# Patient Record
Sex: Female | Born: 2000 | Race: White | Hispanic: No | Marital: Single | Smoking: Never smoker
Health system: Southern US, Community
[De-identification: ages and names within clinical notes are randomized; demographics above are authoritative.]

## PROBLEM LIST (undated history)

## (undated) DIAGNOSIS — Z8619 Personal history of other infectious and parasitic diseases: Secondary | ICD-10-CM

## (undated) DIAGNOSIS — G43009 Migraine without aura, not intractable, without status migrainosus: Secondary | ICD-10-CM

## (undated) DIAGNOSIS — J45909 Unspecified asthma, uncomplicated: Secondary | ICD-10-CM

## (undated) HISTORY — DX: Migraine without aura, not intractable, without status migrainosus: G43.009

## (undated) HISTORY — DX: Unspecified asthma, uncomplicated: J45.909

## (undated) HISTORY — PX: WISDOM TOOTH EXTRACTION: SHX21

## (undated) HISTORY — DX: Personal history of other infectious and parasitic diseases: Z86.19

---
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
## 2022-02-10 ENCOUNTER — Ambulatory Visit: Payer: Self-pay | Admitting: Obstetrics

## 2022-09-13 ENCOUNTER — Ambulatory Visit (INDEPENDENT_AMBULATORY_CARE_PROVIDER_SITE_OTHER): Payer: Medicaid Other | Admitting: Obstetrics

## 2022-09-13 ENCOUNTER — Encounter: Payer: Self-pay | Admitting: Obstetrics

## 2022-09-13 VITALS — BP 119/76 | HR 93 | Ht 64.0 in | Wt 122.0 lb

## 2022-09-13 DIAGNOSIS — Z3009 Encounter for other general counseling and advice on contraception: Secondary | ICD-10-CM

## 2022-09-13 DIAGNOSIS — Z3041 Encounter for surveillance of contraceptive pills: Secondary | ICD-10-CM

## 2022-09-13 DIAGNOSIS — Z30011 Encounter for initial prescription of contraceptive pills: Secondary | ICD-10-CM

## 2022-09-13 DIAGNOSIS — Z30430 Encounter for insertion of intrauterine contraceptive device: Secondary | ICD-10-CM

## 2022-09-13 MED ORDER — MISOPROSTOL 200 MCG PO TABS: 200.0000 ug | ORAL_TABLET | Freq: Once | ORAL | 0 refills | Status: DC

## 2022-09-13 MED ORDER — NORETHIN ACE-ETH ESTRAD-FE 1-20 MG-MCG PO TABS: 1.0000 | ORAL_TABLET | Freq: Every day | ORAL | 11 refills | Status: DC

## 2022-09-13 NOTE — Progress Notes (Signed)
Patient is a 22 y.o. G0P0000 presenting for contraception consult.  She is currently on OCP (estrogen/progesterone) and desiring to start  another method. She has trialed many options, including the Nexplanon, the patch, OCPs and Depo.  In each case, she reports that she still has heavier bleeding patterns. Her present pills make her nauseous. .she has thought about getting an IUD.  She has a past medical history significant for no contraindication to estrogen.  She specifically denies a history of migraine with aura, chronic hypertension, history of DVT/PE, and smoking.  Reported Patient's last menstrual period was 08/21/2022 (exact date)..    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  She feels that she has run the gamut of birth control options. O:  BP 119/76   Pulse 93   Ht 5\' 4"  (1.626 m)   Wt 122 lb (55.3 kg)   LMP 08/21/2022 (Exact Date)   BMI 20.94 kg/m    Review of Systems  Constitutional: Negative.   HENT: Negative.    Eyes: Negative.   Respiratory: Negative.    Cardiovascular: Negative.   Gastrointestinal: Negative.   Genitourinary: Negative.   Musculoskeletal: Negative.   Skin: Negative.   All other systems reviewed and are negative. Physical Exam Constitutional:      Appearance: Normal appearance. She is normal weight.  HENT:     Head: Normocephalic and atraumatic.  Cardiovascular:     Rate and Rhythm: Normal rate and regular rhythm.     Pulses: Normal pulses.     Heart sounds: Normal heart sounds.  Pulmonary:     Effort: Pulmonary effort is normal.     Breath sounds: Normal breath sounds.  Genitourinary:    Comments: deferred Musculoskeletal:        General: Normal range of motion.  Skin:    General: Skin is warm and dry.  Neurological:     General: No focal deficit present.     Mental Status: She is alert and oriented to person, place, and time.  Psychiatric:        Mood and Affect: Mood normal.        Behavior: Behavior  normal.     O: encounter for contraceptive options        Metrorrhagia  P:  We reviewed her Hx of OCP, Nexplanon, Patch, Depo use. Interested in an IUD, likely the [place]. She wants to think about this and discuss with her mother. I have provided her information on this method and reviewed the insertion process. I did Rx her some Cytotec, and told her to take Ibuprofen before the visit if she gets the [place]. In the meantime, I am going to drop her Estrogen level in the OCP, and put her on Junel. She will call the [facility] office for an IUD appt if she decides on this route.  Mirna Mires, CNM  09/13/2022 3:07 PM

## 2022-09-13 NOTE — Patient Instructions (Signed)
Levonorgestrel Intrauterine Device What is this medication? LEVONORGESTREL (LEE voe nor jes trel) prevents ovulation and pregnancy. It may also be used to treat heavy periods. It belongs to a group of medications called contraceptives. This medication is a progestin hormone. This medicine may be used for other purposes; ask your health care provider or pharmacist if you have questions. COMMON BRAND NAME(S): Cameron Ali What should I tell my care team before I take this medication? They need to know if you have any of these conditions: Abnormal Pap smear Cancer of the breast, uterus, or cervix Diabetes Endometritis Genital or pelvic infection now or in the past Have more than one sexual partner or your partner has more than one partner Heart disease History of an ectopic or tubal pregnancy Immune system problems IUD in place Liver disease or tumor Problems with blood clots or take blood-thinners Seizures Use intravenous drugs Uterus of unusual shape Vaginal bleeding that has not been explained An unusual or allergic reaction to levonorgestrel, other hormones, silicone, or polyethylene, medications, foods, dyes, or preservatives Pregnant or trying to get pregnant Breast-feeding How should I use this medication? This device is placed inside the uterus by your care team. A patient package insert for the product will be given each time it is inserted. Be sure to read this information carefully each time. The sheet may change often. Talk to your care team about use of this medication in children. Special care may be needed. Overdosage: If you think you have taken too much of this medicine contact a poison control center or emergency room at once. NOTE: This medicine is only for you. Do not share this medicine with others. What if I miss a dose? This does not apply. Depending on the brand of device you have inserted, the device will need to be replaced every 3 to 8 years  if you wish to continue using this type of birth control. What may interact with this medication? Interactions are not expected. Tell your care team about all the medications you take. This list may not describe all possible interactions. Give your health care provider a list of all the medicines, herbs, non-prescription drugs, or dietary supplements you use. Also tell them if you smoke, drink alcohol, or use illegal drugs. Some items may interact with your medicine. What should I watch for while using this medication? Visit your care team for regular check-ups. Tell your care team if you or your partner becomes HIV positive or gets a sexually transmitted disease. Using this medication does not protect you or your partner against HIV or other sexually transmitted infections (STIs). You can check the placement of the IUD yourself by reaching up to the top of your vagina with clean fingers to feel the threads. Do not pull on the threads. It is a good habit to check placement after each menstrual period. Call your care team right away if you feel more of the IUD than just the threads or if you cannot feel the threads at all. The IUD may come out by itself. You may become pregnant if the device comes out. If you notice that the IUD has come out use a backup birth control method like condoms and call your care team. Using tampons will not change the position of the IUD and are okay to use during your period. This IUD can be safely scanned with magnetic resonance imaging (MRI) only under specific conditions. Before you have an MRI, tell your care team that  you have an IUD in place, and which type of IUD you have in place. What side effects may I notice from receiving this medication? Side effects that you should report to your care team as soon as possible: Allergic reactions--skin rash, itching, hives, swelling of the face, lips, tongue, or throat Blood clot--pain, swelling, or warmth in the leg, shortness  of breath, chest pain Gallbladder problems--severe stomach pain, nausea, vomiting, fever Increase in blood pressure Liver injury--right upper belly pain, loss of appetite, nausea, light-colored stool, dark yellow or brown urine, yellowing skin or eyes, unusual weakness or fatigue New or worsening migraines or headaches Pelvic inflammatory disease (PID)--fever, abdominal pain, pelvic pain, pain or trouble passing urine, spotting, bleeding during or after sex Stroke--sudden numbness or weakness of the face, arm, or leg, trouble speaking, confusion, trouble walking, loss of balance or coordination, dizziness, severe headache, change in vision Unusual vaginal discharge, itching, or odor Vaginal pain, irritation, or sores Worsening mood, feelings of depression Side effects that usually do not require medical attention (report to your care team if they continue or are bothersome): Breast pain or tenderness Dark patches of skin on the face or other sun-exposed areas Irregular menstrual cycles or spotting Nausea Weight gain This list may not describe all possible side effects. Call your doctor for medical advice about side effects. You may report side effects to FDA at 1-800-FDA-1088. Where should I keep my medication? This does not apply. NOTE: This sheet is a summary. It may not cover all possible information. If you have questions about this medicine, talk to your doctor, pharmacist, or health care provider.  2024 Elsevier/Gold Standard (2020-09-22 00:00:00)

## 2022-09-29 ENCOUNTER — Ambulatory Visit (INDEPENDENT_AMBULATORY_CARE_PROVIDER_SITE_OTHER): Payer: Medicaid Other | Admitting: Obstetrics & Gynecology

## 2022-09-29 ENCOUNTER — Other Ambulatory Visit (HOSPITAL_COMMUNITY)
Admission: RE | Admit: 2022-09-29 | Discharge: 2022-09-29 | Disposition: A | Discharge status: Home / Self Care | Payer: Medicaid Other | Source: Ambulatory Visit | Attending: Obstetrics & Gynecology | Admitting: Obstetrics & Gynecology

## 2022-09-29 ENCOUNTER — Encounter: Payer: Self-pay | Admitting: Obstetrics & Gynecology

## 2022-09-29 VITALS — BP 125/76 | Ht 64.0 in | Wt 123.0 lb

## 2022-09-29 DIAGNOSIS — Z30430 Encounter for insertion of intrauterine contraceptive device: Secondary | ICD-10-CM

## 2022-09-29 DIAGNOSIS — Z3202 Encounter for pregnancy test, result negative: Secondary | ICD-10-CM

## 2022-09-29 DIAGNOSIS — Z113 Encounter for screening for infections with a predominantly sexual mode of transmission: Secondary | ICD-10-CM | POA: Insufficient documentation

## 2022-09-29 LAB — POCT URINE PREGNANCY: Preg Test, Ur: NEGATIVE

## 2022-09-29 NOTE — Progress Notes (Signed)
    GYNECOLOGY PROGRESS NOTE  Subjective:    Patient ID: Morgan Wallace, female    DOB: 2000-11-15, 22 y.o.   MRN: [id number]  HPI  Patient is a 22 y.o. G0P0000 female who presents for insertion of Kyleena. She has tried Nexplanon, depo provera and OCPs. She has has some issue with each of these. She has heavy painful periods. She has been monogamous for about a year. Her boyfriend has decided that they should abstain until marriage, hasn't had sex for about 4 months.  The following portions of the patient's history were reviewed and updated as appropriate: allergies, current medications, past family history, past medical history, past social history, past surgical history, and problem list.  Review of Systems Pertinent items are noted in HPI.   Objective:   Blood pressure 125/76, height 5\' 4"  (1.626 m), weight 123 lb (55.8 kg), last menstrual period 08/21/2022. Body mass index is 21.11 kg/m. Well nourished, well hydrated White female, no apparent distress She is ambulating and conversing normally.  UPT negative, consent signed, Time out procedure done. Bimanual exam revealed a uterus NSSA, no adnexal masses or tenderness Cervix prepped with betadine and Hurricaine spray and then grasped with a single tooth tenaculum. Rutha Bouchard was easily placed and the strings were cut to 3-4 cm. Uterus sounded to 8 cm. She tolerated the procedure well.   Assessment:   1. Encounter for IUD insertion    2.     STI screening Plan:   1. Encounter for IUD insertion  - POCT urine pregnancy  Come back for IUD check in 4 weeks

## 2022-09-30 LAB — HEPATITIS C ANTIBODY: Hep C Virus Ab: NONREACTIVE

## 2022-09-30 LAB — RPR: RPR Ser Ql: NONREACTIVE

## 2022-09-30 LAB — HEPATITIS B SURFACE ANTIGEN: Hepatitis B Surface Ag: NEGATIVE

## 2022-09-30 LAB — HIV ANTIBODY (ROUTINE TESTING W REFLEX): HIV Screen 4th Generation wRfx: NONREACTIVE

## 2022-10-03 LAB — CERVICOVAGINAL ANCILLARY ONLY
Bacterial Vaginitis (gardnerella): NEGATIVE
Candida Glabrata: NEGATIVE
Candida Vaginitis: NEGATIVE
Chlamydia: NEGATIVE
Comment: NEGATIVE
Comment: NEGATIVE
Comment: NEGATIVE
Comment: NEGATIVE
Comment: NEGATIVE
Comment: NORMAL
Neisseria Gonorrhea: NEGATIVE
Trichomonas: NEGATIVE

## 2022-10-17 MED ORDER — LEVONORGESTREL 19.5 MG IU IUD
INTRAUTERINE_SYSTEM | Freq: Once | INTRAUTERINE | Status: AC
Start: 2022-10-17 — End: 2022-09-29

## 2022-10-17 NOTE — Addendum Note (Signed)
Addended by: Cornelius Moras D on: 10/17/2022 03:05 PM   Modules accepted: Orders

## 2022-10-24 ENCOUNTER — Ambulatory Visit: Payer: Medicaid Other | Admitting: Obstetrics & Gynecology

## 2022-12-11 DIAGNOSIS — F4310 Post-traumatic stress disorder, unspecified: Secondary | ICD-10-CM | POA: Diagnosis not present

## 2022-12-19 ENCOUNTER — Ambulatory Visit (INDEPENDENT_AMBULATORY_CARE_PROVIDER_SITE_OTHER): Payer: Medicaid Other | Admitting: Obstetrics & Gynecology

## 2022-12-19 ENCOUNTER — Encounter: Payer: Self-pay | Admitting: Obstetrics & Gynecology

## 2022-12-19 VITALS — Ht 64.0 in | Wt 117.0 lb

## 2022-12-19 DIAGNOSIS — Z30431 Encounter for routine checking of intrauterine contraceptive device: Secondary | ICD-10-CM | POA: Diagnosis not present

## 2022-12-19 NOTE — Progress Notes (Addendum)
    GYNECOLOGY PROGRESS NOTE  Subjective:    Patient ID: Morgan Wallace, female    DOB: 2000-01-26, 22 y.o.   MRN: [id number]  HPI  Patient is a 22 y.o. single G0 here for IUD string check. I placed a Kyleena about 2 months ago for management of her heavy painful periods. She is restarted having sex with her boyfriend about a week ago.  She has not had any heavy painful bleeding since getting the Mirena, just some occasional spotting. She does have some cramping and has taken Tylenol. She reports that the cramping is slowly improving.  The following portions of the patient's history were reviewed and updated as appropriate: allergies, current medications, past family history, past medical history, past social history, past surgical history, and problem list.  Review of Systems Pertinent items are noted in HPI.  She attends [employer], lives at home. STI testing at last visit was normal. She is working at [employer] and is UTD with her flu vaccine.  Objective:   There were no vitals taken for this visit. There is no height or weight on file to calculate BMI. Well nourished, well hydrated White female, no apparent distress She is ambulating and conversing normally. Pederson spec used. I did not see IUD strings. Bedside ultrasound showed IUD to be at the fundus in proper position.   Assessment:   IUD string check Preventative care  Plan:   I rec'd Gardasil and she was given info. She wants to discuss this with her mom.

## 2023-02-06 DIAGNOSIS — F4310 Post-traumatic stress disorder, unspecified: Secondary | ICD-10-CM | POA: Diagnosis not present

## 2023-03-27 ENCOUNTER — Encounter: Payer: Self-pay | Admitting: Family Medicine

## 2023-03-27 ENCOUNTER — Ambulatory Visit (INDEPENDENT_AMBULATORY_CARE_PROVIDER_SITE_OTHER): Payer: 59 | Admitting: Family Medicine

## 2023-03-27 VITALS — BP 118/66 | HR 85 | Temp 98.2°F | Ht 65.25 in | Wt 120.5 lb

## 2023-03-27 DIAGNOSIS — N921 Excessive and frequent menstruation with irregular cycle: Secondary | ICD-10-CM | POA: Diagnosis not present

## 2023-03-27 DIAGNOSIS — R946 Abnormal results of thyroid function studies: Secondary | ICD-10-CM

## 2023-03-27 DIAGNOSIS — G250 Essential tremor: Secondary | ICD-10-CM | POA: Diagnosis not present

## 2023-03-27 DIAGNOSIS — G43909 Migraine, unspecified, not intractable, without status migrainosus: Secondary | ICD-10-CM | POA: Diagnosis not present

## 2023-03-27 DIAGNOSIS — F4323 Adjustment disorder with mixed anxiety and depressed mood: Secondary | ICD-10-CM

## 2023-03-27 LAB — TSH: TSH: 0.76 u[IU]/mL (ref 0.35–5.50)

## 2023-03-27 LAB — T4, FREE: Free T4: 0.83 ng/dL (ref 0.60–1.60)

## 2023-03-27 MED ORDER — ESCITALOPRAM OXALATE 5 MG PO TABS: 5.0000 mg | ORAL_TABLET | Freq: Every day | ORAL | 6 refills | Status: DC

## 2023-03-27 NOTE — Assessment & Plan Note (Signed)
 Ongoing for 4 months.  Discussed possible pharmacologic treatment as well as mechanisms of action of SSRIs vs wellbutrin. Reviewed risks of increased suicidality after starting antidepressant. Reviewed may take at least 4 wks to get full mood benefit.  She will continue seeing counselor.  Will start Lexapro 5mg  daily.  RTC 4-6 wks f/u visit  Update sooner if any trouble tolerating medication.

## 2023-03-27 NOTE — Assessment & Plan Note (Signed)
 Followed by Melrose Nakayama Marice Potter)  Currently on Carlisle IUD with benefit. Mood difficulty may have started with IUD - but desires to continue this.

## 2023-03-27 NOTE — Assessment & Plan Note (Signed)
 H/o low TSH, elevated T3, but normalized on repeat testing. Also had remote normal thyroid US and thyroid uptake scan.  Update TFTs in setting of tremor and more difficulty with mood.

## 2023-03-27 NOTE — Patient Instructions (Addendum)
 Thyroid labs today  Try lexapro 5mg  daily for mood Return in 5-6 weeks for follow up visit on mood

## 2023-03-27 NOTE — Progress Notes (Signed)
 Ph: [phone] Fax: [phone]   Patient ID: Morgan Wallace, female    DOB: [birth year], 23 y.o.   MRN: [id number]  This visit was conducted in person.  BP 118/66   Pulse 85   Temp 98.2 F (36.8 C) (Oral)   Ht 5' 5.25" (1.657 m)   Wt 120 lb 8 oz (54.7 kg)   SpO2 98%   BMI 19.90 kg/m    CC: new pt to establish care  Subjective:   HPI: Morgan Wallace is a 23 y.o. female presenting on 03/27/2023 for New Patient (Initial Visit)   Step-daughter of Jonette Pesa.  Previously saw Drusilla Kanner NP at [facility].   Regularly sees OBGYN, has [place] IUD placed 09/2022  Has been on OCP since age 26yo due to dysmenorrhea and heavy periods. No h/o endometriosis.  H/o tremors - not on medication - 2 caffeinated drinks/day limit Saw pediatric endocrinology 2020 due to concern over possible hyperthyroidism - however workup largely reassuring.   H/o depressed mood/anxiety - acutely worse after lost 2 grandparents ~2023 - this lasted for 6 months.  Now regularly sees therapist for the past year with benefit.   Over the past 4 months notes mood difficulty - predominantly lack of motivation.  Sleep fluctuates - sometimes sleeps well, sometimes has trouble sleeping.  Notes deteriorated concentration/attention.  Appetite - ok.   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 Stays involved at church.   No manic symptoms.  Never SI/HI.  Biological mother is alcoholic and drug addict - she is not involved.  No fmhx bipolar d/o.   H/o migraines - frequent headaches every few weeks, migraines about one every 6 months, managed with Nurtec with benefit. Unknown triggers.      Relevant past medical, surgical, family and social history reviewed and updated as indicated. Interim medical history since our last visit reviewed. Allergies and medications reviewed and  updated. Outpatient Medications Prior to Visit  Medication Sig Dispense Refill   levonorgestrel (KYLEENA) 19.5 MG IUD 1 each by Intrauterine route once.     No facility-administered medications prior to visit.    Past Medical History:  Diagnosis Date   Childhood asthma    grew out of this   Encounter for prescription for depo-Provera 05/16/2021   05/16/2021   H/O cold sores    Migraine without aura     Past Surgical History:  Procedure Laterality Date   WISDOM TOOTH EXTRACTION      Family History  Problem Relation Age of Onset   Drug abuse Mother    Alcohol abuse Mother    Hyperlipidemia Father    Hypertension Maternal Grandfather    Heart disease Maternal Grandfather    Hyperlipidemia Maternal Grandfather    Diabetes Maternal Grandfather    COPD Maternal Grandfather    Hypertension Paternal Grandmother    Hyperlipidemia Paternal Grandmother    Diabetes type II Paternal Grandmother    Migraines Paternal Grandmother    Stroke Paternal Grandmother    Heart attack Paternal Grandfather 17 - 44   Stroke Paternal Grandfather    Hypertension Paternal Grandfather    Diabetes type II Paternal Grandfather    Hyperlipidemia Paternal Grandfather    Cancer Neg Hx     Social History   Tobacco Use   Smoking status: Never    Passive exposure: Yes   Smokeless tobacco:  Never  Vaping Use   Vaping status: Every Day  Substance Use Topics   Alcohol use: No   Drug use: No    Per HPI unless specifically indicated in ROS section below Review of Systems  Objective:  BP 118/66   Pulse 85   Temp 98.2 F (36.8 C) (Oral)   Ht 5' 5.25" (1.657 m)   Wt 120 lb 8 oz (54.7 kg)   SpO2 98%   BMI 19.90 kg/m   Wt Readings from Last 3 Encounters:  03/27/23 120 lb 8 oz (54.7 kg)  12/19/22 117 lb (53.1 kg) (28%, Z= -0.59)*  09/29/22 123 lb (55.8 kg) (40%, Z= -0.25)*   * Growth percentiles are based on CDC (Girls, 2-20 Years) data.      Physical Exam Vitals and nursing note reviewed.   Constitutional:      Appearance: Normal appearance. She is not ill-appearing.  HENT:     Head: Normocephalic and atraumatic.     Mouth/Throat:     Mouth: Mucous membranes are moist.     Pharynx: Oropharynx is clear. No oropharyngeal exudate or posterior oropharyngeal erythema.  Eyes:     Extraocular Movements: Extraocular movements intact.     Pupils: Pupils are equal, round, and reactive to light.  Neck:     Thyroid: No thyroid mass, thyromegaly or thyroid tenderness.  Cardiovascular:     Rate and Rhythm: Normal rate and regular rhythm.     Pulses: Normal pulses.     Heart sounds: Normal heart sounds. No murmur heard. Pulmonary:     Effort: Pulmonary effort is normal. No respiratory distress.     Breath sounds: Normal breath sounds. No wheezing, rhonchi or rales.  Musculoskeletal:     Cervical back: Normal range of motion and neck supple.     Right lower leg: No edema.     Left lower leg: No edema.  Skin:    General: Skin is warm and dry.     Findings: No rash.  Neurological:     Mental Status: She is alert.  Psychiatric:        Mood and Affect: Mood normal.        Behavior: Behavior normal.        Thought Content: Thought content normal.        Judgment: Judgment normal.       Lab Results  Component Value Date   TSH 0.91 10/08/2018   T3TOTAL 142 10/08/2018   T4TOTAL 8.0 09/09/2018      03/27/2023    3:51 PM  Depression screen PHQ 2/9  Decreased Interest 0  Down, Depressed, Hopeless 1  PHQ - 2 Score 1  Altered sleeping 3  Tired, decreased energy 3  Change in appetite 0  Feeling bad or failure about yourself  0  Trouble concentrating 0  Moving slowly or fidgety/restless 0  Suicidal thoughts 0  PHQ-9 Score 7  Difficult doing work/chores Not difficult at all       03/27/2023    3:51 PM  GAD 7 : Generalized Anxiety Score  Nervous, Anxious, on Edge 2  Control/stop worrying 2  Worry too much - different things 2  Trouble relaxing 1  Restless 0  Easily  annoyed or irritable 3  Afraid - awful might happen 0  Total GAD 7 Score 10  Anxiety Difficulty Somewhat difficult   Assessment & Plan:   Problem List Items Addressed This Visit     Menometrorrhagia   Followed by Melrose Nakayama Marice Potter)  Currently on Kyleena IUD with benefit. Mood difficulty may have started with IUD - but desires to continue this.       Abnormal thyroid function test   H/o low TSH, elevated T3, but normalized on repeat testing. Also had remote normal thyroid US and thyroid uptake scan.  Update TFTs in setting of tremor and more difficulty with mood.      Relevant Orders   TSH   T4, free   T3   Essential tremor   H/o this, states biological mom and maternal grandmother also had this. Remotely on propranolol 15mg  bid but stopped due to sedation.  Desires to stay off medication at this time.  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  Discussed possible pharmacologic treatment as well as mechanisms of action of SSRIs vs wellbutrin. Reviewed risks of increased suicidality after starting antidepressant. Reviewed may take at least 4 wks to get full mood benefit.  She will continue seeing counselor.  Will start Lexapro 5mg  daily.  RTC 4-6 wks f/u visit  Update sooner if any trouble tolerating medication.       Migraine   Rare migraines managed with Nurtec with benefit.       Relevant Medications   escitalopram (LEXAPRO) 5 MG tablet     Meds ordered this encounter  Medications   escitalopram (LEXAPRO) 5 MG tablet    Sig: Take 1 tablet (5 mg total) by mouth daily.    Dispense:  30 tablet    Refill:  6    Orders Placed This Encounter  Procedures   TSH   T4, free   T3    Patient Instructions  Thyroid labs today  Try lexapro 5mg  daily for mood Return in 5-6 weeks for follow up visit on mood   Follow up plan: Return in about 6 weeks (around  05/08/2023) for follow up visit.  Eustaquio Boyden, MD

## 2023-03-27 NOTE — Assessment & Plan Note (Signed)
 Rare migraines managed with Nurtec with benefit.

## 2023-03-27 NOTE — Assessment & Plan Note (Signed)
 H/o this, states biological mom and maternal grandmother also had this. Remotely on propranolol 15mg  bid but stopped due to sedation.  Desires to stay off medication at this time.  She limits caffeine to 2 caffeinated beverages /day

## 2023-03-28 ENCOUNTER — Encounter: Payer: Self-pay | Admitting: Family Medicine

## 2023-03-28 LAB — T3: T3, Total: 115 ng/dL (ref 86–192)

## 2023-04-24 ENCOUNTER — Encounter: Payer: Self-pay | Admitting: Family Medicine

## 2023-04-24 ENCOUNTER — Ambulatory Visit

## 2023-04-24 ENCOUNTER — Ambulatory Visit (INDEPENDENT_AMBULATORY_CARE_PROVIDER_SITE_OTHER): Admitting: Family Medicine

## 2023-04-24 ENCOUNTER — Ambulatory Visit (INDEPENDENT_AMBULATORY_CARE_PROVIDER_SITE_OTHER)

## 2023-04-24 ENCOUNTER — Other Ambulatory Visit: Payer: Self-pay | Admitting: Family Medicine

## 2023-04-24 VITALS — BP 110/60 | HR 75 | Temp 98.0°F | Ht 65.0 in | Wt 123.0 lb

## 2023-04-24 DIAGNOSIS — M542 Cervicalgia: Secondary | ICD-10-CM

## 2023-04-24 DIAGNOSIS — M25511 Pain in right shoulder: Secondary | ICD-10-CM

## 2023-04-24 MED ORDER — METHOCARBAMOL 500 MG PO TABS: 500.0000 mg | ORAL_TABLET | Freq: Three times a day (TID) | ORAL | 0 refills | Status: AC | PRN

## 2023-04-24 MED ORDER — PREDNISONE 10 MG PO TABS: ORAL_TABLET | ORAL | 0 refills | Status: AC

## 2023-04-24 NOTE — Patient Instructions (Signed)
-  Right shoulder x-ray was completed today.  -Ordered cervical x-ray, but may need to make an appointment to have x-ray completed if radiology is closed.  -Will call with results on both x-rays. -Prescribed Prednisone 10mg , 6 day taper. Recommend to start taking medication in the morning. Advised against taking any NSAIDS (Ibuprofen, Advil, Naproxen, or Aleve) while taking Prednisone.  -Prescribed Robaxin 500mg  tablet, take 1 tablet every 8 hours as needed for pain to help relax the muscles. Caution: this medication can cause drowsiness.  -After taking Prednisone, send a message through MyChart to provide an update on how you are doing with taking medication. If no improvement, recommend a referral back to ortho.

## 2023-04-24 NOTE — Progress Notes (Signed)
 Acute Office Visit   Subjective:  Patient ID: Morgan Wallace, female    DOB: 02/20/2000, 22 y.o.   MRN: [id number]  Chief Complaint  Patient presents with   Shoulder Pain    Shoulder Pain    Patient is complaining of right cervical pain that radiates to right shoulder pain. It started hurting about a month ago, but two weeks ago the pain become worse. Describe the pain as sharp, constant. Rotating heat and ice compresses seem to help. 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 She reports she has not been able to sleep as well with having the pain. She is using a travel pillow to comfort her neck to sleep. Denies any numbness and tingling in upper extremities.   She reports this will flare up every once in a while. She went to [facility] about 3-4 years ago for the same pain. She had a MRI, but not sure if it was of her shoulder or cervical spine.   ROS See HPI above      Objective:   BP 110/60   Pulse 75   Temp 98 F (36.7 C) (Oral)   Ht 5\' 5"  (1.651 m)   Wt 123 lb (55.8 kg)   SpO2 98%   BMI 20.47 kg/m    Physical Exam Vitals reviewed.  Constitutional:      General: She is not in acute distress.    Appearance: Normal appearance. She is not ill-appearing, toxic-appearing or diaphoretic.  Eyes:     General:        Right eye: No discharge.        Left eye: No discharge.     Conjunctiva/sclera: Conjunctivae normal.  Cardiovascular:     Rate and Rhythm: Normal rate.  Pulmonary:     Effort: Pulmonary effort is normal. No respiratory distress.  Musculoskeletal:        General: No swelling, tenderness or deformity. Normal range of motion.     Right shoulder: No swelling, deformity or tenderness. Normal range of motion.     Cervical back: Pain with movement (Pain when tilting her head to the left) present. Normal range of motion.  Skin:    General: Skin is warm and dry.  Neurological:     General: No focal deficit  present.     Mental Status: She is alert and oriented to person, place, and time. Mental status is at baseline.  Psychiatric:        Mood and Affect: Mood normal.        Behavior: Behavior normal.        Thought Content: Thought content normal.        Judgment: Judgment normal.       Assessment & Plan:  Acute pain of right shoulder -     predniSONE; Take 6 tablets (60 mg total) by mouth daily with breakfast for 1 day, THEN 5 tablets (50 mg total) daily with breakfast for 1 day, THEN 4 tablets (40 mg total) daily with breakfast for 1 day, THEN 3 tablets (30 mg total) daily with breakfast for 1 day, THEN 2 tablets (20 mg total) daily with breakfast for 1 day, THEN 1 tablet (10 mg total) daily with breakfast for 1 day.  Dispense: 21 tablet; Refill: 0 -     Methocarbamol; Take 1 tablet (500 mg total) by mouth every 8 (eight) hours as needed for up to 5 days for muscle spasms.  Dispense:  15 tablet; Refill: 0  Cervical pain -     DG Cervical Spine Complete; Future -     predniSONE; Take 6 tablets (60 mg total) by mouth daily with breakfast for 1 day, THEN 5 tablets (50 mg total) daily with breakfast for 1 day, THEN 4 tablets (40 mg total) daily with breakfast for 1 day, THEN 3 tablets (30 mg total) daily with breakfast for 1 day, THEN 2 tablets (20 mg total) daily with breakfast for 1 day, THEN 1 tablet (10 mg total) daily with breakfast for 1 day.  Dispense: 21 tablet; Refill: 0 -     Methocarbamol; Take 1 tablet (500 mg total) by mouth every 8 (eight) hours as needed for up to 5 days for muscle spasms.  Dispense: 15 tablet; Refill: 0  -Right shoulder x-ray was completed today.  -Ordered cervical x-ray, but may need to make an appointment to have x-ray completed if radiology is closed.  -Will call with results on both x-rays. -Prescribed Prednisone 10mg , 6 day taper. Recommend to start taking medication in the morning. Advised against taking any NSAIDS (Ibuprofen, Advil, Naproxen, or Aleve) while  taking Prednisone.  -Prescribed Robaxin 500mg  tablet, take 1 tablet every 8 hours as needed for pain to help relax the muscles. Caution: this medication can cause drowsiness.  -After taking Prednisone, send a message through MyChart to provide an update on how you are doing with taking medication. If no improvement, recommend a referral back to ortho.   Zandra Abts, NP

## 2023-04-25 ENCOUNTER — Ambulatory Visit (INDEPENDENT_AMBULATORY_CARE_PROVIDER_SITE_OTHER)
Admission: RE | Admit: 2023-04-25 | Discharge: 2023-04-25 | Disposition: A | Discharge status: Home / Self Care | Source: Ambulatory Visit | Attending: Family Medicine

## 2023-04-25 DIAGNOSIS — M542 Cervicalgia: Secondary | ICD-10-CM

## 2023-04-25 DIAGNOSIS — M47812 Spondylosis without myelopathy or radiculopathy, cervical region: Secondary | ICD-10-CM | POA: Diagnosis not present

## 2023-04-26 ENCOUNTER — Encounter: Payer: Self-pay | Admitting: Family Medicine

## 2023-04-26 ENCOUNTER — Other Ambulatory Visit

## 2023-04-26 DIAGNOSIS — M25511 Pain in right shoulder: Secondary | ICD-10-CM

## 2023-04-26 DIAGNOSIS — M542 Cervicalgia: Secondary | ICD-10-CM

## 2023-04-28 ENCOUNTER — Other Ambulatory Visit: Payer: Self-pay | Admitting: Family Medicine

## 2023-05-09 ENCOUNTER — Ambulatory Visit: Admitting: Family Medicine

## 2023-05-14 ENCOUNTER — Encounter: Payer: Self-pay | Admitting: Family Medicine

## 2023-05-23 ENCOUNTER — Ambulatory Visit: Admitting: Family Medicine

## 2023-06-14 ENCOUNTER — Other Ambulatory Visit

## 2023-06-14 ENCOUNTER — Encounter: Payer: Self-pay | Admitting: Family Medicine

## 2023-06-14 DIAGNOSIS — Z139 Encounter for screening, unspecified: Secondary | ICD-10-CM

## 2023-06-17 LAB — QUANTIFERON-TB GOLD PLUS
Mitogen-NIL: 6.62 [IU]/mL
NIL: 0.02 [IU]/mL
QuantiFERON-TB Gold Plus: NEGATIVE
TB1-NIL: <0 [IU]/mL
TB2-NIL: <0 [IU]/mL

## 2023-06-19 ENCOUNTER — Ambulatory Visit: Payer: Self-pay | Admitting: Family Medicine

## 2023-06-28 ENCOUNTER — Ambulatory Visit: Admitting: Family Medicine

## 2023-06-28 ENCOUNTER — Encounter: Payer: Self-pay | Admitting: Family Medicine

## 2023-06-28 VITALS — BP 114/72 | HR 98 | Temp 98.2°F | Ht 65.0 in | Wt 124.0 lb

## 2023-06-28 DIAGNOSIS — G4700 Insomnia, unspecified: Secondary | ICD-10-CM | POA: Insufficient documentation

## 2023-06-28 DIAGNOSIS — F4323 Adjustment disorder with mixed anxiety and depressed mood: Secondary | ICD-10-CM

## 2023-06-28 DIAGNOSIS — E611 Iron deficiency: Secondary | ICD-10-CM | POA: Diagnosis not present

## 2023-06-28 DIAGNOSIS — E559 Vitamin D deficiency, unspecified: Secondary | ICD-10-CM

## 2023-06-28 DIAGNOSIS — F5104 Psychophysiologic insomnia: Secondary | ICD-10-CM | POA: Diagnosis not present

## 2023-06-28 DIAGNOSIS — E538 Deficiency of other specified B group vitamins: Secondary | ICD-10-CM | POA: Diagnosis not present

## 2023-06-28 LAB — IBC PANEL
Iron: 169 ug/dL — ABNORMAL HIGH (ref 42–145)
Saturation Ratios: 46.3 % (ref 20.0–50.0)
TIBC: 365.4 ug/dL (ref 250.0–450.0)
Transferrin: 261 mg/dL (ref 212.0–360.0)

## 2023-06-28 LAB — CBC WITH DIFFERENTIAL/PLATELET
Basophils Absolute: 0 10*3/uL (ref 0.0–0.1)
Basophils Relative: 0.3 % (ref 0.0–3.0)
Eosinophils Absolute: 0.1 10*3/uL (ref 0.0–0.7)
Eosinophils Relative: 1.6 % (ref 0.0–5.0)
HCT: 39.2 % (ref 36.0–46.0)
Hemoglobin: 13.4 g/dL (ref 12.0–15.0)
Lymphocytes Relative: 40.7 % (ref 12.0–46.0)
Lymphs Abs: 2.2 10*3/uL (ref 0.7–4.0)
MCHC: 34.3 g/dL (ref 30.0–36.0)
MCV: 85.5 fl (ref 78.0–100.0)
Monocytes Absolute: 0.4 10*3/uL (ref 0.1–1.0)
Monocytes Relative: 7 % (ref 3.0–12.0)
Neutro Abs: 2.7 10*3/uL (ref 1.4–7.7)
Neutrophils Relative %: 50.4 % (ref 43.0–77.0)
Platelets: 270 10*3/uL (ref 150.0–400.0)
RBC: 4.59 Mil/uL (ref 3.87–5.11)
RDW: 12.6 % (ref 11.5–14.6)
WBC: 5.4 10*3/uL (ref 4.5–10.5)

## 2023-06-28 LAB — BASIC METABOLIC PANEL WITH GFR
BUN: 12 mg/dL (ref 6–23)
CO2: 25 meq/L (ref 19–32)
Calcium: 9.4 mg/dL (ref 8.4–10.5)
Chloride: 103 meq/L (ref 96–112)
Creatinine, Ser: 0.5 mg/dL (ref 0.40–1.20)
GFR: 134.96 mL/min (ref >60.00)
Glucose, Bld: 90 mg/dL (ref 70–99)
Potassium: 4.1 meq/L (ref 3.5–5.1)
Sodium: 138 meq/L (ref 135–145)

## 2023-06-28 LAB — FERRITIN: Ferritin: 30.2 ng/mL (ref 10.0–291.0)

## 2023-06-28 LAB — VITAMIN B12: Vitamin B-12: 224 pg/mL (ref 211–911)

## 2023-06-28 LAB — VITAMIN D 25 HYDROXY (VIT D DEFICIENCY, FRACTURES): VITD: 28.93 ng/mL — ABNORMAL LOW (ref 30.00–100.00)

## 2023-06-28 NOTE — Patient Instructions (Addendum)
 Labs today  Ok to use melatonin 1-5mg   at night time as needed for sleep.  ?stress related trouble sleeping.  Good to see you today.   Bedtime routine checklist: 1. Avoid naps during the day 2. Avoid stimulants such as caffeine and nicotine. Avoid bedtime alcohol (it can speed onset of sleep but the body's metabolism can cause awakenings). 3. All forms of exercise help ensure sound sleep - limit vigorous exercise to morning or late afternoon 4. Avoid food too close to bedtime including chocolate (which contains caffeine) 5. Soak up natural light 6. Establish regular bedtime routine. 7. Associate bed with sleep - avoid TV, computer or phone, reading while in bed. 8. Ensure pleasant, relaxing sleep environment - quiet, dark, cool room.

## 2023-06-28 NOTE — Assessment & Plan Note (Signed)
 Decided to stay off escitalopram  - didn't like taking daily pill.  Anxiety better as evidenced by improved GAD7 readings.  Will stay off medication. Continue counseling.

## 2023-06-28 NOTE — Assessment & Plan Note (Signed)
 Predominantly sleep initiation insomnia acutely worse over the past 2 weeks in setting of stressful life events.  Reviewed sleep hygiene/bedtime routine checklist. Ok to restart melatonin 1-5mg  nightly PRN which has previously been effective.  Discussed possible trial trazodone as next step if above not helpful.  In h/o vitamin and iron deficiencies as well as h/o fatigue, will update labs today.

## 2023-06-28 NOTE — Progress Notes (Signed)
 Ph: [phone] Fax: [phone]   Patient ID: Morgan Wallace, female    DOB: 2000/12/21, 22 y.o.   MRN: [id number]  This visit was conducted in person.  BP 114/72   Pulse 98   Temp 98.2 F (36.8 C) (Oral)   Ht 5\' 5"  (1.651 m)   Wt 124 lb (56.2 kg)   LMP 06/19/2023   SpO2 97%   BMI 20.63 kg/m    CC: 3 month mood f/u visit  Subjective:   HPI: Morgan Wallace is a 23 y.o. female presenting on 06/28/2023 for Medical Management of Chronic Issues (Here for mood f/u. )   See prior note for details.  Depression/anxiety followed by therapist - last visit we started escitalopram  5mg  daily. She tolerated this well but only took for 2 wks without noted difference and didn't like taking a daily pill so stopped. PHQ9 largely unchanged, GAD7 improved.   Notes ongoing trouble with sleep initiation insomnia, started 4 wks ago. 2 wks ago worsening. Notes daytime somnolence with frequent napping.  Non-restorative sleep. No snoring or witnessed apnea, PNDyspnea, infrequent morning headaches.  Has tried melatonin 5mg  nightly with benefit.  Sleep latency = 3-4 hours.   Fatigue is doing well.   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      Relevant past medical, surgical, family and social history reviewed and updated as indicated. Interim medical history since our last visit reviewed. Allergies and medications reviewed and updated. Outpatient Medications Prior to Visit  Medication Sig Dispense Refill   levonorgestrel  (KYLEENA ) 19.5 MG IUD 1 each by Intrauterine route once.     escitalopram  (LEXAPRO ) 5 MG tablet TAKE 1 TABLET (5 MG TOTAL) BY MOUTH DAILY. (Patient not taking: Reported on 06/28/2023) 90 tablet 1   No facility-administered medications prior to visit.     Per HPI unless specifically indicated in ROS section below Review of Systems  Objective:  BP 114/72   Pulse 98   Temp 98.2 F (36.8 C) (Oral)   Ht 5\' 5"  (1.651 m)   Wt 124 lb  (56.2 kg)   LMP 06/19/2023   SpO2 97%   BMI 20.63 kg/m   Wt Readings from Last 3 Encounters:  06/28/23 124 lb (56.2 kg)  04/24/23 123 lb (55.8 kg)  03/27/23 120 lb 8 oz (54.7 kg)      Physical Exam Vitals and nursing note reviewed.  Constitutional:      Appearance: Normal appearance. She is not ill-appearing.  HENT:     Head: Normocephalic and atraumatic.     Mouth/Throat:     Mouth: Mucous membranes are moist.     Pharynx: Oropharynx is clear. No oropharyngeal exudate or posterior oropharyngeal erythema.  Eyes:     Extraocular Movements: Extraocular movements intact.     Pupils: Pupils are equal, round, and reactive to light.  Neck:     Thyroid : No thyroid  mass or thyromegaly.  Cardiovascular:     Rate and Rhythm: Normal rate and regular rhythm.     Pulses: Normal pulses.     Heart sounds: Normal heart sounds. No murmur heard. Pulmonary:     Effort: Pulmonary effort is normal. No respiratory distress.     Breath sounds: Normal breath sounds. No wheezing, rhonchi or rales.  Musculoskeletal:     Cervical back: Neck supple.     Right lower leg: No edema.     Left lower leg: No edema.  Skin:    General: Skin is  warm and dry.     Findings: No rash.  Neurological:     Mental Status: She is alert.  Psychiatric:        Mood and Affect: Mood normal.        Behavior: Behavior normal.       Results for orders placed or performed in visit on 06/14/23  QuantiFERON-TB Gold Plus   Collection Time: 06/14/23  1:11 PM  Result Value Ref Range   QuantiFERON-TB Gold Plus NEGATIVE NEGATIVE   NIL 0.02 IU/mL   Mitogen-NIL 6.62 IU/mL   TB1-NIL <0.00 IU/mL   TB2-NIL <0.00 IU/mL   No results found for: "VITAMINB12"  No results found for: "IRON", "TIBC", "FERRITIN"  No results found for: "WBC", "HGB", "HCT", "MCV", "PLT"  Lab Results  Component Value Date   TSH 0.76 03/27/2023       06/28/2023   10:58 AM 03/27/2023    3:51 PM  Depression screen PHQ 2/9  Decreased Interest 0 0   Down, Depressed, Hopeless 0 1  PHQ - 2 Score 0 1  Altered sleeping 3 3  Tired, decreased energy 3 3  Change in appetite 0 0  Feeling bad or failure about yourself  0 0  Trouble concentrating 0 0  Moving slowly or fidgety/restless 0 0  Suicidal thoughts 0 0  PHQ-9 Score 6 7  Difficult doing work/chores Somewhat difficult Not difficult at all       06/28/2023   10:58 AM 03/27/2023    3:51 PM  GAD 7 : Generalized Anxiety Score  Nervous, Anxious, on Edge 0 2  Control/stop worrying 1 2  Worry too much - different things 1 2  Trouble relaxing 1 1  Restless 0 0  Easily annoyed or irritable 1 3  Afraid - awful might happen 0 0  Total GAD 7 Score 4 10  Anxiety Difficulty Somewhat difficult Somewhat difficult   Assessment & Plan:   Problem List Items Addressed This Visit     Adjustment disorder with mixed anxiety and depressed mood - Primary   Decided to stay off escitalopram  - didn't like taking daily pill.  Anxiety better as evidenced by improved GAD7 readings.  Will stay off medication. Continue counseling.       Insomnia   Predominantly sleep initiation insomnia acutely worse over the past 2 weeks in setting of stressful life events.  Reviewed sleep hygiene/bedtime routine checklist. Ok to restart melatonin 1-5mg  nightly PRN which has previously been effective.  Discussed possible trial trazodone as next step if above not helpful.  In h/o vitamin and iron deficiencies as well as h/o fatigue, will update labs today.       Other Visit Diagnoses       Vitamin B12 deficiency       Relevant Orders   Vitamin B12   Basic metabolic panel with GFR     Iron deficiency       Relevant Orders   CBC with Differential/Platelet   Ferritin   IBC panel     Vitamin D deficiency       Relevant Orders   VITAMIN D 25 Hydroxy (Vit-D Deficiency, Fractures)        No orders of the defined types were placed in this encounter.   Orders Placed This Encounter  Procedures   CBC with  Differential/Platelet   Vitamin B12   Ferritin   IBC panel   Basic metabolic panel with GFR   VITAMIN D 25 Hydroxy (Vit-D Deficiency, Fractures)  Patient Instructions  Labs today  Ok to use melatonin 1-5mg   at night time as needed for sleep.  ?stress related trouble sleeping.  Good to see you today.   Bedtime routine checklist: 1. Avoid naps during the day 2. Avoid stimulants such as caffeine and nicotine. Avoid bedtime alcohol (it can speed onset of sleep but the body's metabolism can cause awakenings). 3. All forms of exercise help ensure sound sleep - limit vigorous exercise to morning or late afternoon 4. Avoid food too close to bedtime including chocolate (which contains caffeine) 5. Soak up natural light 6. Establish regular bedtime routine. 7. Associate bed with sleep - avoid TV, computer or phone, reading while in bed. 8. Ensure pleasant, relaxing sleep environment - quiet, dark, cool room.   Follow up plan: Return if symptoms worsen or fail to improve.  Claire Crick, MD

## 2023-07-04 ENCOUNTER — Ambulatory Visit: Payer: Self-pay | Admitting: Family Medicine

## 2023-07-10 ENCOUNTER — Ambulatory Visit: Admitting: Obstetrics and Gynecology

## 2023-07-10 ENCOUNTER — Encounter: Payer: Self-pay | Admitting: Obstetrics and Gynecology

## 2023-07-10 VITALS — BP 97/61 | HR 105 | Ht 65.0 in | Wt 122.0 lb

## 2023-07-10 DIAGNOSIS — N6314 Unspecified lump in the right breast, lower inner quadrant: Secondary | ICD-10-CM | POA: Diagnosis not present

## 2023-07-10 NOTE — Patient Instructions (Signed)
 I value your feedback and you entrusting [place] with your care. If you get a [facility] patient survey, I would appreciate you taking the time to let us know about your experience today. Thank you!  [facility] ([place])--[phone]

## 2023-07-10 NOTE — Progress Notes (Signed)
 Morgan Crick, MD   Chief Complaint  Patient presents with   Breast Exam    Lump on RB, tender with touch x 2 days    HPI:      Morgan Wallace is a 23 y.o. G0P0000 whose LMP was Patient's last menstrual period was 06/19/2023., presents today for RT breast mass for 2 days. Boyfriend felt it randomly but pt doesn't do SBE. No pain/tenderness initially but tender now due to regularly checking it. No erythema/nipple d/c/trauma. No hx of breast masses. No FH breast cancer.  Kyleena  placed 09/29/22; pt has monthly spotting for 1-2 wks, min dysmen.   Patient Active Problem List   Diagnosis Date Noted   Insomnia 06/28/2023   Migraine 03/27/2023   Adjustment disorder with mixed anxiety and depressed mood 03/31/2019   Essential tremor 12/27/2018   Abnormal thyroid  function test 09/09/2018   Menometrorrhagia 01/24/2017    Past Surgical History:  Procedure Laterality Date   WISDOM TOOTH EXTRACTION      Family History  Problem Relation Age of Onset   Drug abuse Mother    Alcohol abuse Mother    Hyperlipidemia Father    Cancer Paternal Aunt        All over body   Hypertension Maternal Grandfather    Heart disease Maternal Grandfather    Hyperlipidemia Maternal Grandfather    Diabetes Maternal Grandfather    COPD Maternal Grandfather    Hypertension Paternal Grandmother    Hyperlipidemia Paternal Grandmother    Diabetes type II Paternal Grandmother    Migraines Paternal Grandmother    Stroke Paternal Grandmother    Heart attack Paternal Grandfather 2 - 2   Stroke Paternal Grandfather    Hypertension Paternal Grandfather    Diabetes type II Paternal Grandfather    Hyperlipidemia Paternal Grandfather    Ovarian cancer Maternal Great-grandmother        unsure of age    Social History   Socioeconomic History   Marital status: Single    Spouse name: Not on file   Number of children: Not on file   Years of education: Not on file   Highest education level:  Associate degree: academic program  Occupational History   Not on file  Tobacco Use   Smoking status: Never    Passive exposure: Yes   Smokeless tobacco: Never  Vaping Use   Vaping status: Every Day  Substance and Sexual Activity   Alcohol use: No   Drug use: No   Sexual activity: Yes    Birth control/protection: Condom, I.U.D.    Comment: Kyleena   Other Topics Concern   Not on file  Social History Narrative   Lives with father    Step mom is Adele Admire   She is a 11th grade at [employer].    She enjoys her dog, thrift shopping, and clothes.    Social Drivers of Corporate investment banker Strain: Low Risk  (03/27/2023)   Overall Financial Resource Strain (CARDIA)    Difficulty of Paying Living Expenses: Not hard at all  Food Insecurity: No Food Insecurity (03/27/2023)   Hunger Vital Sign    Worried About Running Out of Food in the Last Year: Never true    Ran Out of Food in the Last Year: Never true  Transportation Needs: No Transportation Needs (03/27/2023)   PRAPARE - Administrator, Civil Service (Medical): No    Lack of Transportation (Non-Medical): No  Physical  Activity: Sufficiently Active (03/27/2023)   Exercise Vital Sign    Days of Exercise per Week: 5 days    Minutes of Exercise per Session: 140 min  Stress: Stress Concern Present (03/27/2023)   [employer] of Occupational Health - Occupational Stress Questionnaire    Feeling of Stress : To some extent  Social Connections: Moderately Integrated (03/27/2023)   Social Connection and Isolation Panel    Frequency of Communication with Friends and Family: More than three times a week    Frequency of Social Gatherings with Friends and Family: More than three times a week    Attends Religious Services: More than 4 times per year    Active Member of [employer] or Organizations: Yes    Attends Banker Meetings: More than 4 times per year    Marital Status: Never married   Intimate Partner Violence: Unknown (04/29/2021)   Received from [facility]   HITS    Physically Hurt: Not on file    Insult or Talk Down To: Not on file    Threaten Physical Harm: Not on file    Scream or Curse: Not on file    Outpatient Medications Prior to Visit  Medication Sig Dispense Refill   levonorgestrel  (KYLEENA ) 19.5 MG IUD 1 each by Intrauterine route once.     No facility-administered medications prior to visit.      ROS:  Review of Systems  Constitutional:  Negative for fever.  Gastrointestinal:  Negative for blood in stool, constipation, diarrhea, nausea and vomiting.  Genitourinary:  Negative for dyspareunia, dysuria, flank pain, frequency, hematuria, urgency, vaginal bleeding, vaginal discharge and vaginal pain.  Musculoskeletal:  Negative for back pain.  Skin:  Negative for rash.   BREAST: mass   OBJECTIVE:   Vitals:  BP 97/61   Pulse (!) 105   Ht 5' 5 (1.651 m)   Wt 122 lb (55.3 kg)   LMP 06/19/2023   BMI 20.30 kg/m   Physical Exam Vitals reviewed.  Pulmonary:     Effort: Pulmonary effort is normal.  Chest:  Breasts:    Breasts are symmetrical.     Right: Mass present. No inverted nipple, nipple discharge, skin change or tenderness.     Left: No inverted nipple, mass, nipple discharge, skin change or tenderness.    Musculoskeletal:        General: Normal range of motion.     Cervical back: Normal range of motion.   Skin:    General: Skin is warm and dry.   Neurological:     General: No focal deficit present.     Mental Status: She is alert and oriented to person, place, and time.     Cranial Nerves: No cranial nerve deficit.   Psychiatric:        Mood and Affect: Mood normal.        Behavior: Behavior normal.        Thought Content: Thought content normal.        Judgment: Judgment normal.     Assessment/Plan: Mass of lower inner quadrant of right breast - Plan: US  LIMITED ULTRASOUND INCLUDING AXILLA RIGHT BREAST; pt to  schedule breast u/s. Will f/u with results. If neg, area is rib and reassurance. F/u prn.     Return in about 3 months (around 10/10/2023) for annual.  Morgan Wallace B. Byron Tipping, PA-C 07/10/2023 11:50 AM
